# Patient Record
Sex: Male | Born: 2000 | Race: Black or African American | Hispanic: No | Marital: Single | Smoking: Never smoker
Health system: Southern US, Community
[De-identification: ages and names within clinical notes are randomized; demographics above are authoritative.]

---
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
## 2022-01-30 DIAGNOSIS — Z419 Encounter for procedure for purposes other than remedying health state, unspecified: Secondary | ICD-10-CM | POA: Diagnosis not present

## 2022-02-01 ENCOUNTER — Other Ambulatory Visit: Payer: Self-pay

## 2022-02-01 ENCOUNTER — Encounter (HOSPITAL_BASED_OUTPATIENT_CLINIC_OR_DEPARTMENT_OTHER): Payer: Self-pay | Admitting: Emergency Medicine

## 2022-02-01 DIAGNOSIS — R040 Epistaxis: Secondary | ICD-10-CM | POA: Insufficient documentation

## 2022-02-01 NOTE — ED Triage Notes (Signed)
Pt here from home with c/o nose bleeds of and on since Friday , no bleeding upon arrival

## 2022-02-02 ENCOUNTER — Emergency Department (HOSPITAL_BASED_OUTPATIENT_CLINIC_OR_DEPARTMENT_OTHER)
Admission: EM | Admit: 2022-02-02 | Discharge: 2022-02-02 | Disposition: A | Discharge status: Home / Self Care | Payer: Medicaid Other | Attending: Emergency Medicine | Admitting: Emergency Medicine

## 2022-02-02 DIAGNOSIS — R040 Epistaxis: Secondary | ICD-10-CM

## 2022-02-02 NOTE — ED Provider Notes (Signed)
   [facility] EMERGENCY DEPT  Provider Note  CSN: [id number] Arrival date & time: 02/01/22 2140  History Chief Complaint  Patient presents with   Epistaxis    Ryan Perry is a 21 y.o. male here with intermittent left sided nose bleeds for the last 2 days. Bleeding stopped prior to arrival but he missed work due to the bleeding and needs a note. Currently he is asymptomatic but he has had large clots from his L naris.    Home Medications Prior to Admission medications   Not on File     Allergies    Patient has no known allergies.   Review of Systems   Review of Systems Please see HPI for pertinent positives and negatives  Physical Exam BP 114/76   Pulse 100   Temp 97.9 F (36.6 C) (Oral)   Resp 18   SpO2 98%   Physical Exam Vitals and nursing note reviewed.  HENT:     Head: Normocephalic.     Nose:     Comments: No active bleeding, likely source of recent bleeding on L anterior nasal septum Eyes:     Extraocular Movements: Extraocular movements intact.  Pulmonary:     Effort: Pulmonary effort is normal.  Musculoskeletal:        General: Normal range of motion.     Cervical back: Neck supple.  Skin:    Findings: No rash (on exposed skin).  Neurological:     Mental Status: He is alert and oriented to person, place, and time.  Psychiatric:        Mood and Affect: Mood normal.     ED Results / Procedures / Treatments   EKG None  Procedures Procedures  Medications Ordered in the ED Medications - No data to display  Initial Impression and Plan  Patient reassured. Given instructions on home management of nose bleeds including Afrin and pressure. Advised to return if bleeding does not stop. Recommend a humidifier and nasal saline drops as well.   ED Course       MDM Rules/Calculators/A&P Medical Decision Making Problems Addressed: Epistaxis: acute illness or injury  Risk OTC drugs.    Final Clinical Impression(s) / ED  Diagnoses Final diagnoses:  Epistaxis    Rx / DC Orders ED Discharge Orders     None        Pollyann Savoy, MD 02/02/22 0107

## 2022-02-12 ENCOUNTER — Emergency Department (HOSPITAL_BASED_OUTPATIENT_CLINIC_OR_DEPARTMENT_OTHER)
Admission: EM | Admit: 2022-02-12 | Discharge: 2022-02-12 | Disposition: A | Discharge status: Home / Self Care | Payer: Medicaid Other | Attending: Emergency Medicine | Admitting: Emergency Medicine

## 2022-02-12 ENCOUNTER — Other Ambulatory Visit: Payer: Self-pay

## 2022-02-12 ENCOUNTER — Encounter (HOSPITAL_BASED_OUTPATIENT_CLINIC_OR_DEPARTMENT_OTHER): Payer: Self-pay

## 2022-02-12 DIAGNOSIS — D649 Anemia, unspecified: Secondary | ICD-10-CM | POA: Diagnosis not present

## 2022-02-12 DIAGNOSIS — D696 Thrombocytopenia, unspecified: Secondary | ICD-10-CM | POA: Diagnosis not present

## 2022-02-12 DIAGNOSIS — R040 Epistaxis: Secondary | ICD-10-CM | POA: Diagnosis not present

## 2022-02-12 MED ORDER — OXYMETAZOLINE HCL 0.05 % NA SOLN: 1.0000 | Freq: Two times a day (BID) | NASAL | 0 refills | Status: AC

## 2022-02-12 NOTE — ED Triage Notes (Signed)
Patient here POV from Home.  Endorses Nose Bleed Intermittently for 1-2 Weeks. Seen for Same recently but returns for cauterization.   ENT Consultation scheduled for January. Not bleeding currently.   NAD noted during Triage. A&Ox4. GCS 15. Ambulatory.

## 2022-02-12 NOTE — ED Notes (Signed)
Pt verbalized understanding of d/c instructions, meds, and followup care. Denies questions. VSS, no distress noted. Steady gait to exit with all belongings.  ?

## 2022-02-12 NOTE — ED Provider Notes (Signed)
[facility] EMERGENCY DEPT Provider Note   CSN: [id number] Arrival date & time: 02/12/22  1916     History  Chief Complaint  Patient presents with   Epistaxis    Shelton Square is a 21 y.o. male.  Intermittent left-sided epistaxis since December 1.  No bleeding currently.  States been having bleeding on a regular basis when he works for [employer] and has been sent home multiple times.  Unable to see ENT until January.  No trauma.  No history of blood thinner use.  No history of coagulopathy.  No difficulty breathing or difficulty swallowing.  The history is provided by the patient.  Epistaxis Associated symptoms: no cough and no fever        Home Medications Prior to Admission medications   Not on File      Allergies    Patient has no known allergies.    Review of Systems   Review of Systems  Constitutional:  Negative for activity change, appetite change and fever.  HENT:  Positive for nosebleeds.   Respiratory:  Negative for cough, chest tightness and shortness of breath.   Gastrointestinal:  Negative for abdominal pain, nausea and vomiting.  Genitourinary:  Negative for dysuria and hematuria.  Musculoskeletal:  Negative for arthralgias and myalgias.  Neurological:  Negative for weakness.    all other systems are negative except as noted in the HPI and PMH.   Physical Exam Updated Vital Signs BP 111/69 (BP Location: Left Arm)   Pulse 84   Temp 98.2 F (36.8 C)   Resp 18   Ht 5' 7.5" (1.715 m)   Wt 62.5 kg   SpO2 100%   BMI 21.26 kg/m  Physical Exam Vitals and nursing note reviewed.  Constitutional:      General: He is not in acute distress.    Appearance: He is well-developed.  HENT:     Head: Normocephalic and atraumatic.     Nose:     Comments: No septal hematoma or hemotympanum.  No active bleeding. abraded appearance of nasal septum bilaterally No blood in oropharynx    Mouth/Throat:     Pharynx: No oropharyngeal exudate.  Eyes:      Conjunctiva/sclera: Conjunctivae normal.     Pupils: Pupils are equal, round, and reactive to light.  Neck:     Comments: No meningismus. Cardiovascular:     Rate and Rhythm: Normal rate and regular rhythm.     Heart sounds: Normal heart sounds. No murmur heard. Pulmonary:     Effort: Pulmonary effort is normal. No respiratory distress.     Breath sounds: Normal breath sounds.  Chest:     Chest wall: No tenderness.  Abdominal:     Palpations: Abdomen is soft.     Tenderness: There is no abdominal tenderness. There is no guarding or rebound.  Musculoskeletal:        General: No tenderness. Normal range of motion.     Cervical back: Normal range of motion and neck supple.  Skin:    General: Skin is warm.  Neurological:     General: No focal deficit present.     Mental Status: He is alert and oriented to person, place, and time. Mental status is at baseline.     Cranial Nerves: No cranial nerve deficit.     Motor: No abnormal muscle tone.     Coordination: Coordination normal.     Comments:  5/5 strength throughout. CN 2-12 intact.Equal grip strength.   Psychiatric:  Behavior: Behavior normal.     ED Results / Procedures / Treatments   Labs (all labs ordered are listed, but only abnormal results are displayed) Labs Reviewed  CBC WITH DIFFERENTIAL/PLATELET    EKG None  Radiology No results found.  Procedures Procedures    Medications Ordered in ED Medications - No data to display  ED Course/ Medical Decision Making/ A&P                           Medical Decision Making Amount and/or Complexity of Data Reviewed Labs: ordered. Decision-making details documented in ED Course. Radiology: ordered and independent interpretation performed. Decision-making details documented in ED Course. ECG/medicine tests: ordered and independent interpretation performed. Decision-making details documented in ED Course.  Risk OTC drugs.   Recurrent nasal bleeding for the  past several weeks.  No active bleeding currently.  Offered CBC to assess for anemia and thrombocytopenia.  Patient declines.  No active bleeding.  Recommend pressure, Afrin, ENT follow-up.  Return precautions discussed        Final Clinical Impression(s) / ED Diagnoses Final diagnoses:  Epistaxis    Rx / DC Orders ED Discharge Orders     None         Tauni Sanks, Jeannett Senior, MD 02/12/22 2322

## 2022-02-12 NOTE — Discharge Instructions (Signed)
You declined blood work today.  Follow-up with the ear nose and throat doctor as scheduled.  If bleeding recurs hold pressure on the nose for 20 to 30 minutes before checking return to the ER.

## 2022-03-02 DIAGNOSIS — Z419 Encounter for procedure for purposes other than remedying health state, unspecified: Secondary | ICD-10-CM | POA: Diagnosis not present

## 2022-04-02 DIAGNOSIS — Z419 Encounter for procedure for purposes other than remedying health state, unspecified: Secondary | ICD-10-CM | POA: Diagnosis not present

## 2022-04-29 DIAGNOSIS — R040 Epistaxis: Secondary | ICD-10-CM | POA: Diagnosis not present

## 2022-04-29 DIAGNOSIS — J3489 Other specified disorders of nose and nasal sinuses: Secondary | ICD-10-CM | POA: Diagnosis not present

## 2022-05-01 DIAGNOSIS — Z419 Encounter for procedure for purposes other than remedying health state, unspecified: Secondary | ICD-10-CM | POA: Diagnosis not present

## 2022-06-01 DIAGNOSIS — Z419 Encounter for procedure for purposes other than remedying health state, unspecified: Secondary | ICD-10-CM | POA: Diagnosis not present

## 2022-07-01 DIAGNOSIS — Z419 Encounter for procedure for purposes other than remedying health state, unspecified: Secondary | ICD-10-CM | POA: Diagnosis not present

## 2022-08-01 DIAGNOSIS — Z419 Encounter for procedure for purposes other than remedying health state, unspecified: Secondary | ICD-10-CM | POA: Diagnosis not present

## 2022-08-31 DIAGNOSIS — Z419 Encounter for procedure for purposes other than remedying health state, unspecified: Secondary | ICD-10-CM | POA: Diagnosis not present

## 2022-10-01 DIAGNOSIS — Z419 Encounter for procedure for purposes other than remedying health state, unspecified: Secondary | ICD-10-CM | POA: Diagnosis not present

## 2022-11-01 DIAGNOSIS — Z419 Encounter for procedure for purposes other than remedying health state, unspecified: Secondary | ICD-10-CM | POA: Diagnosis not present

## 2022-12-01 DIAGNOSIS — Z419 Encounter for procedure for purposes other than remedying health state, unspecified: Secondary | ICD-10-CM | POA: Diagnosis not present

## 2023-01-01 DIAGNOSIS — Z419 Encounter for procedure for purposes other than remedying health state, unspecified: Secondary | ICD-10-CM | POA: Diagnosis not present

## 2023-01-28 ENCOUNTER — Encounter (HOSPITAL_BASED_OUTPATIENT_CLINIC_OR_DEPARTMENT_OTHER): Payer: Self-pay

## 2023-01-28 ENCOUNTER — Emergency Department (HOSPITAL_BASED_OUTPATIENT_CLINIC_OR_DEPARTMENT_OTHER)
Admission: EM | Admit: 2023-01-28 | Discharge: 2023-01-28 | Disposition: A | Discharge status: Home / Self Care | Payer: Medicaid Other | Attending: Emergency Medicine | Admitting: Emergency Medicine

## 2023-01-28 ENCOUNTER — Other Ambulatory Visit: Payer: Self-pay

## 2023-01-28 DIAGNOSIS — R040 Epistaxis: Secondary | ICD-10-CM | POA: Insufficient documentation

## 2023-01-28 MED ORDER — OXYMETAZOLINE HCL 0.05 % NA SOLN
1.0000 | Freq: Once | NASAL | Status: AC
  Administered 2023-01-28: 1 via NASAL
  Filled 2023-01-28: qty 30

## 2023-01-28 NOTE — ED Provider Notes (Signed)
[place] EMERGENCY DEPARTMENT AT [facility] Provider Note   CSN: [id number] Arrival date & time: 01/28/23  2127     History Chief Complaint  Patient presents with   Epistaxis    Ryan Perry is a 22 y.o. male.  Patient presents to the emergency department concerns of nosebleed.  Reports that bleeding began about 1 hour prior to arriving and has not resolved.  Has tried to apply pressure to the area and is currently holding a small towel under nose without direct pressure applied.  No significant bleeding appreciated.  Has not tried any nasal sprays for the bleeding.  Denies any recent nasal trauma or digital trauma to the nasal passages.  Not on any blood thinners and no history of any coagulopathy.   Epistaxis      Home Medications Prior to Admission medications   Not on File      Allergies    Patient has no known allergies.    Review of Systems   Review of Systems  HENT:  Positive for nosebleeds.   All other systems reviewed and are negative.   Physical Exam Updated Vital Signs BP 124/81   Pulse 70   Temp 98.8 F (37.1 C) (Oral)   Resp 18   Ht 5\' 7"  (1.702 m)   Wt 59 kg   SpO2 100%   BMI 20.36 kg/m  Physical Exam Vitals and nursing note reviewed.  Constitutional:      General: He is not in acute distress.    Appearance: He is well-developed.  HENT:     Head: Normocephalic and atraumatic.     Nose: Nose normal.     Comments: Clots noted in bilateral nasal passage. No septal hematoma. No other visible deformity. Eyes:     Conjunctiva/sclera: Conjunctivae normal.  Cardiovascular:     Rate and Rhythm: Normal rate and regular rhythm.     Heart sounds: No murmur heard. Pulmonary:     Effort: Pulmonary effort is normal. No respiratory distress.     Breath sounds: Normal breath sounds.  Abdominal:     Palpations: Abdomen is soft.     Tenderness: There is no abdominal tenderness.  Musculoskeletal:        General: No swelling.     Cervical  back: Neck supple.  Skin:    General: Skin is warm and dry.     Capillary Refill: Capillary refill takes less than 2 seconds.  Neurological:     Mental Status: He is alert.  Psychiatric:        Mood and Affect: Mood normal.     ED Results / Procedures / Treatments   Labs (all labs ordered are listed, but only abnormal results are displayed) Labs Reviewed - No data to display  EKG None  Radiology No results found.  Procedures Procedures   Medications Ordered in ED Medications  oxymetazoline (AFRIN) 0.05 % nasal spray 1 spray (1 spray Each Nare Given by Other 01/28/23 2137)    ED Course/ Medical Decision Making/ A&P                               Medical Decision Making Risk OTC drugs.   This patient presents to the ED for concern of epistaxis.  Differential diagnosis includes dental trauma, nasal fracture, septal hematoma, seasonal allergies   Medicines ordered and prescription drug management:  I ordered medication including Afrin for nosebleeds Reevaluation of the patient after  these medicines showed that the patient improved I have reviewed the patients home medicines and have made adjustments as needed   Problem List / ED Course:  Patient with history of nosebleeds presents the emergency department concerns of epistaxis.  Reports that his current episode has been ongoing for about 9 or so and is not any stop with direct pressure.  No nasal sprays applied.  Not on any blood thinners and denies any recent nasal trauma or injury.  On presentation, patient appears to be holding a towel under his nose with no active bleeding seen but some clots noted on towel.  Patient himself is not pale, diaphoretic, no notable pallor seen in conjunctiva.  Will attempt management with Afrin nasal spray. Afrin spray applied with monitoring for about 30 minutes. No recurrence in bleeding seen. Suspect bleeding due to vulnerable vessels in anterior septum and dry air. Encouraged  application of nasal saline gel along bilateral septum surfaces as well as use of Afrin as needed for future nosebleeds. Encouraged patient to follow up with ENT for repeat assessment given some worsening in nosebleeds recently. No acute indication for labs at this time as patient is not symptomatically anemic and is well appearing. Patient and family in agreement with plan and will follow up with ENT. Discharged home in stable condition.  Final Clinical Impression(s) / ED Diagnoses Final diagnoses:  Epistaxis    Rx / DC Orders ED Discharge Orders     None         Smitty Knudsen, PA-C 01/28/23 2321    Melene Plan, DO 01/29/23 1518

## 2023-01-28 NOTE — Discharge Instructions (Signed)
You are seen in the emergency department today for a nosebleed.  Your nosebleed was stopped with use of Afrin nasal spray.  I would recommend using this medication at home as needed for significant nasal bleeding.  Please plan on following up with your ENT for further evaluation.  If you are able to get bleeding to stop while at home, return to the emergency department.

## 2023-01-28 NOTE — ED Triage Notes (Signed)
Pt complaining of a nose bleed for an hour.

## 2023-01-31 DIAGNOSIS — Z419 Encounter for procedure for purposes other than remedying health state, unspecified: Secondary | ICD-10-CM | POA: Diagnosis not present

## 2023-03-03 DIAGNOSIS — Z419 Encounter for procedure for purposes other than remedying health state, unspecified: Secondary | ICD-10-CM | POA: Diagnosis not present

## 2023-04-03 DIAGNOSIS — Z419 Encounter for procedure for purposes other than remedying health state, unspecified: Secondary | ICD-10-CM | POA: Diagnosis not present

## 2023-05-01 DIAGNOSIS — Z419 Encounter for procedure for purposes other than remedying health state, unspecified: Secondary | ICD-10-CM | POA: Diagnosis not present

## 2023-06-12 DIAGNOSIS — Z419 Encounter for procedure for purposes other than remedying health state, unspecified: Secondary | ICD-10-CM | POA: Diagnosis not present

## 2023-07-12 DIAGNOSIS — Z419 Encounter for procedure for purposes other than remedying health state, unspecified: Secondary | ICD-10-CM | POA: Diagnosis not present

## 2023-08-12 DIAGNOSIS — Z419 Encounter for procedure for purposes other than remedying health state, unspecified: Secondary | ICD-10-CM | POA: Diagnosis not present

## 2023-11-12 DIAGNOSIS — Z419 Encounter for procedure for purposes other than remedying health state, unspecified: Secondary | ICD-10-CM | POA: Diagnosis not present

## 2024-02-13 ENCOUNTER — Emergency Department (HOSPITAL_COMMUNITY)
Admission: EM | Admit: 2024-02-13 | Discharge: 2024-02-13 | Disposition: A | Discharge status: Home / Self Care | Attending: Emergency Medicine | Admitting: Emergency Medicine

## 2024-02-13 ENCOUNTER — Other Ambulatory Visit: Payer: Self-pay

## 2024-02-13 DIAGNOSIS — J040 Acute laryngitis: Secondary | ICD-10-CM

## 2024-02-13 LAB — RESP PANEL BY RT-PCR (RSV, FLU A&B, COVID)  RVPGX2
Influenza A by PCR: NEGATIVE
Influenza B by PCR: NEGATIVE
Resp Syncytial Virus by PCR: NEGATIVE
SARS Coronavirus 2 by RT PCR: NEGATIVE

## 2024-02-13 LAB — GROUP A STREP BY PCR: Group A Strep by PCR: NOT DETECTED

## 2024-02-13 NOTE — ED Triage Notes (Signed)
Pt has had a sore throat since Thursday.

## 2024-02-13 NOTE — Discharge Instructions (Addendum)
 Be sure to drink plenty of fluids.  You may use ibuprofen or Tylenol for pain.  You appear to have a viral infection causing laryngitis which affects your voice.  This is self-limiting/should resolve on its own.   However, if you develop difficulty swallowing, difficulty breathing, fever, or any other new/concerning symptoms then return to the ER.

## 2024-02-13 NOTE — ED Provider Notes (Signed)
°  [place] EMERGENCY DEPARTMENT AT [facility] Provider Note   CSN: [id number] Arrival date & time: 02/13/24  0434     Patient presents with: Sore Throat   Ryan Perry is a 23 y.o. male.   HPI 23 year old male presents with worse voice and sore throat.  Patient states that 3 days ago he started having a mild sore throat and has had a little bit of cough and congestion.  Those symptoms are improving.  His throat does not really hurt but his voice is not quite right.  He denies any difficulty swallowing, difficulty breathing, or any fevers.  No neck pain or swelling.  No sick contacts.  Prior to Admission medications  Not on File    Allergies: Patient has no known allergies.    Review of Systems  Constitutional:  Negative for fever.  HENT:  Positive for sore throat and voice change. Negative for trouble swallowing.   Respiratory:  Positive for cough. Negative for shortness of breath.   Musculoskeletal:  Negative for neck pain.    Updated Vital Signs BP 120/82 (BP Location: Right Arm)   Pulse 81   Temp 97.6 F (36.4 C)   Resp 16   SpO2 100%   Physical Exam Vitals and nursing note reviewed.  Constitutional:      General: He is not in acute distress.    Appearance: He is well-developed. He is not ill-appearing or diaphoretic.  HENT:     Head: Normocephalic and atraumatic.     Mouth/Throat:     Pharynx: Uvula midline. No pharyngeal swelling, oropharyngeal exudate or uvula swelling.     Tonsils: No tonsillar exudate or tonsillar abscesses.  Cardiovascular:     Rate and Rhythm: Normal rate and regular rhythm.     Heart sounds: Normal heart sounds.  Pulmonary:     Effort: Pulmonary effort is normal.     Breath sounds: Normal breath sounds. No stridor. No wheezing, rhonchi or rales.  Abdominal:     General: There is no distension.  Skin:    General: Skin is warm and dry.  Neurological:     Mental Status: He is alert.     (all labs ordered are listed,  but only abnormal results are displayed) Labs Reviewed  GROUP A STREP BY PCR  RESP PANEL BY RT-PCR (RSV, FLU A&B, COVID)  RVPGX2    EKG: None  Radiology: No results found.   Procedures   Medications Ordered in the ED - No data to display                                  Medical Decision Making Amount and/or Complexity of Data Reviewed Labs:     Details: COVID, flu, RSV, strep testing all negative   Patient appears to have mild laryngitis.  He has a mildly hoarse voice but no stridor.  No trismus or oropharyngeal swelling.  No obvious bacterial infection.  Strep testing and COVID/flu/RSV are all negative.  This should be self-limiting, discussed making sure he stays hydrated and we also discussed return precautions such as fever, difficulty swallowing, difficulty breathing, etc.  Highly doubt a deep space neck infection.     Final diagnoses:  Laryngitis    ED Discharge Orders     None          Freddi Hamilton, MD 02/13/24 [phone]

## 2024-02-24 ENCOUNTER — Emergency Department (HOSPITAL_COMMUNITY)
Admission: EM | Admit: 2024-02-24 | Discharge: 2024-02-24 | Disposition: A | Discharge status: Home / Self Care | Attending: Emergency Medicine | Admitting: Emergency Medicine

## 2024-02-24 DIAGNOSIS — J101 Influenza due to other identified influenza virus with other respiratory manifestations: Secondary | ICD-10-CM | POA: Diagnosis not present

## 2024-02-24 DIAGNOSIS — R059 Cough, unspecified: Secondary | ICD-10-CM | POA: Diagnosis present

## 2024-02-24 LAB — URINALYSIS, ROUTINE W REFLEX MICROSCOPIC
Bacteria, UA: NONE SEEN
Bilirubin Urine: NEGATIVE
Glucose, UA: NEGATIVE mg/dL
Hgb urine dipstick: NEGATIVE
Ketones, ur: 5 mg/dL — AB
Leukocytes,Ua: NEGATIVE
Nitrite: NEGATIVE
Protein, ur: 100 mg/dL — AB
Specific Gravity, Urine: 1.02 (ref 1.005–1.030)
pH: 5 (ref 5.0–8.0)

## 2024-02-24 LAB — RESP PANEL BY RT-PCR (RSV, FLU A&B, COVID)  RVPGX2
Influenza A by PCR: POSITIVE — AB
Influenza B by PCR: NEGATIVE
Resp Syncytial Virus by PCR: NEGATIVE
SARS Coronavirus 2 by RT PCR: NEGATIVE

## 2024-02-24 MED ORDER — ONDANSETRON HCL 4 MG PO TABS: 4.0000 mg | ORAL_TABLET | Freq: Four times a day (QID) | ORAL | 0 refills | Status: AC

## 2024-02-24 NOTE — ED Triage Notes (Signed)
 Pt reports URI s/s x Monday and unable to keep food down.  Pt reports urinary freq x Wednesday

## 2024-02-24 NOTE — ED Provider Notes (Signed)
 " [place] EMERGENCY DEPARTMENT AT [facility] Provider Note   CSN: [id number] Arrival date & time: 02/24/24  1606     Patient presents with: URI and Urinary Frequency   Ryan Perry is a 23 y.o. male who presented today with primary concerns of nasal congestion, sore throat, cough.  He also has decreased oral intake secondary to decreased appetite, increased nausea without any vomiting.  He also states that is at increased frequency of bowel movements, but denies increased urinary frequency as initially reported during triage assessment.  States that stools are loose to liquid, and that he has had about 4-5 loose stools today.  States he also has productive cough with thick clear to yellow sputum, chest discomfort associated with cough along with nausea associated with cough.    URI Presenting symptoms: congestion, fatigue and sore throat   Urinary Frequency       Prior to Admission medications  Medication Sig Start Date End Date Taking? Authorizing Provider  ondansetron  (ZOFRAN ) 4 MG tablet Take 1 tablet (4 mg total) by mouth every 6 (six) hours. 02/24/24  Yes Myriam Dorn BROCKS, PA    Allergies: Patient has no known allergies.    Review of Systems  Constitutional:  Positive for appetite change, chills and fatigue.  HENT:  Positive for congestion, sinus pressure and sore throat.   All other systems reviewed and are negative.   Updated Vital Signs BP 119/70 (BP Location: Right Arm)   Pulse 80   Temp 98.1 F (36.7 C) (Oral)   Resp 18   Ht 5' 7 (1.702 m)   Wt 63.5 kg   SpO2 99%   BMI 21.93 kg/m   Physical Exam Vitals and nursing note reviewed.  Constitutional:      General: He is awake. He is not in acute distress.    Appearance: Normal appearance. He is well-developed, well-groomed and normal weight.  HENT:     Head: Normocephalic and atraumatic.     Mouth/Throat:     Mouth: Mucous membranes are moist.     Pharynx: Oropharynx is clear. Uvula  midline.  Eyes:     General: Lids are normal. Vision grossly intact. Gaze aligned appropriately.     Extraocular Movements: Extraocular movements intact.     Conjunctiva/sclera: Conjunctivae normal.     Pupils: Pupils are equal, round, and reactive to light.  Cardiovascular:     Rate and Rhythm: Normal rate and regular rhythm.     Pulses: Normal pulses.     Heart sounds: Normal heart sounds. No murmur heard.    No friction rub. No gallop.  Pulmonary:     Effort: Pulmonary effort is normal.     Breath sounds: Normal breath sounds.  Abdominal:     General: Abdomen is flat. Bowel sounds are normal.     Palpations: Abdomen is soft.     Tenderness: There is no abdominal tenderness.  Musculoskeletal:        General: Normal range of motion.     Cervical back: Normal range of motion and neck supple.     Right lower leg: No edema.     Left lower leg: No edema.  Skin:    General: Skin is warm and dry.     Capillary Refill: Capillary refill takes less than 2 seconds.  Neurological:     General: No focal deficit present.     Mental Status: He is alert and oriented to person, place, and time. Mental status is at  baseline.     GCS: GCS eye subscore is 4. GCS verbal subscore is 5. GCS motor subscore is 6.  Psychiatric:        Mood and Affect: Mood normal.        Behavior: Behavior is cooperative.     (all labs ordered are listed, but only abnormal results are displayed) Labs Reviewed  RESP PANEL BY RT-PCR (RSV, FLU A&B, COVID)  RVPGX2 - Abnormal; Notable for the following components:      Result Value   Influenza A by PCR POSITIVE (*)    All other components within normal limits  URINALYSIS, ROUTINE W REFLEX MICROSCOPIC - Abnormal; Notable for the following components:   Ketones, ur 5 (*)    Protein, ur 100 (*)    All other components within normal limits    EKG: None  Radiology: No results found.   Procedures   Medications Ordered in the ED - No data to display                                   Medical Decision Making Amount and/or Complexity of Data Reviewed Labs: ordered.  Risk Prescription drug management.   Secondary to his initial reports of increased urinary frequency a UA was obtained to assess for potential UTI which is negative.  However he does not report any urinary frequency during the exam, stating it is more increased frequency of loose stools.  Given his complaints of congestion, sore throat, cough, and fatigue and malaise, suspect this is likely a viral prodrome secondary to either flu or COVID.  Obtain nasopharyngeal swab to assess respiratory panel, COVID/flu/RSV which is positive for influenza A.  Physical exam of this patient is largely unremarkable with nontender abdomen, normal air entry into all lung fields without any adventitious sounds appreciated, posterior oropharynx is slightly erythematous but otherwise normal-appearing.  Given the positive influenza A screen, find this is consistent with viral prodrome secondary to influenza infection I will manage symptomatically.  This was thoroughly discussed with the patient which she verbalizes understanding agreement.  Will also secondary to his complaints of profound nausea give him a course of ondansetron , though we will discharge with outpatient follow-up as he is able to tolerate liquids without difficulty.     Final diagnoses:  Influenza A    ED Discharge Orders          Ordered    ondansetron  (ZOFRAN ) 4 MG tablet  Every 6 hours        02/24/24 1722               Myriam Dorn BROCKS, [place] 02/24/24 1728    Patt Alm Macho, MD 02/26/24 1455  "
